# Patient Record
Sex: Female | Born: 1970 | Race: White | Hispanic: No | Marital: Married | Smoking: Never smoker
Health system: Southern US, Community
[De-identification: ages and names within clinical notes are randomized; demographics above are authoritative.]

## PROBLEM LIST (undated history)

## (undated) DIAGNOSIS — I10 Essential (primary) hypertension: Secondary | ICD-10-CM

## (undated) DIAGNOSIS — E785 Hyperlipidemia, unspecified: Secondary | ICD-10-CM

## (undated) DIAGNOSIS — E119 Type 2 diabetes mellitus without complications: Secondary | ICD-10-CM

## (undated) DIAGNOSIS — E079 Disorder of thyroid, unspecified: Secondary | ICD-10-CM

## (undated) DIAGNOSIS — F32A Depression, unspecified: Secondary | ICD-10-CM

## (undated) DIAGNOSIS — A159 Respiratory tuberculosis unspecified: Secondary | ICD-10-CM

## (undated) HISTORY — DX: Hyperlipidemia, unspecified: E78.5

## (undated) HISTORY — DX: Respiratory tuberculosis unspecified: A15.9

## (undated) HISTORY — DX: Depression, unspecified: F32.A

## (undated) HISTORY — DX: Essential (primary) hypertension: I10

## (undated) HISTORY — PX: GASTRIC FUNDOPLICATION: SHX226

## (undated) HISTORY — DX: Disorder of thyroid, unspecified: E07.9

## (undated) HISTORY — DX: Type 2 diabetes mellitus without complications: E11.9

---

## 1997-08-18 ENCOUNTER — Inpatient Hospital Stay (HOSPITAL_COMMUNITY): Admission: AD | Admit: 1997-08-18 | Discharge: 1997-08-20 | Payer: Self-pay | Admitting: *Deleted

## 1998-11-19 ENCOUNTER — Other Ambulatory Visit: Admission: RE | Admit: 1998-11-19 | Discharge: 1998-11-19 | Payer: Self-pay | Admitting: Family Medicine

## 1998-12-19 ENCOUNTER — Other Ambulatory Visit: Admission: RE | Admit: 1998-12-19 | Discharge: 1998-12-19 | Payer: Self-pay | Admitting: Obstetrics and Gynecology

## 1999-05-23 ENCOUNTER — Other Ambulatory Visit: Admission: RE | Admit: 1999-05-23 | Discharge: 1999-05-23 | Payer: Self-pay | Admitting: Obstetrics and Gynecology

## 2000-01-03 ENCOUNTER — Other Ambulatory Visit: Admission: RE | Admit: 2000-01-03 | Discharge: 2000-01-03 | Payer: Self-pay | Admitting: Family Medicine

## 2000-06-15 ENCOUNTER — Other Ambulatory Visit: Admission: RE | Admit: 2000-06-15 | Discharge: 2000-06-15 | Payer: Self-pay | Admitting: Obstetrics and Gynecology

## 2000-09-10 ENCOUNTER — Inpatient Hospital Stay (HOSPITAL_COMMUNITY): Admission: AD | Admit: 2000-09-10 | Discharge: 2000-09-10 | Payer: Self-pay | Admitting: Obstetrics and Gynecology

## 2001-01-14 ENCOUNTER — Inpatient Hospital Stay (HOSPITAL_COMMUNITY): Admission: AD | Admit: 2001-01-14 | Discharge: 2001-01-16 | Payer: Self-pay | Admitting: Obstetrics and Gynecology

## 2001-01-17 ENCOUNTER — Encounter: Admission: RE | Admit: 2001-01-17 | Discharge: 2001-02-16 | Payer: Self-pay | Admitting: Obstetrics and Gynecology

## 2001-02-26 ENCOUNTER — Other Ambulatory Visit: Admission: RE | Admit: 2001-02-26 | Discharge: 2001-02-26 | Payer: Self-pay | Admitting: Obstetrics and Gynecology

## 2002-02-14 ENCOUNTER — Other Ambulatory Visit: Admission: RE | Admit: 2002-02-14 | Discharge: 2002-02-14 | Payer: Self-pay | Admitting: Family Medicine

## 2004-05-07 ENCOUNTER — Other Ambulatory Visit: Admission: RE | Admit: 2004-05-07 | Discharge: 2004-05-07 | Payer: Self-pay | Admitting: *Deleted

## 2005-05-29 ENCOUNTER — Other Ambulatory Visit: Admission: RE | Admit: 2005-05-29 | Discharge: 2005-05-29 | Payer: Self-pay | Admitting: Family Medicine

## 2005-06-12 ENCOUNTER — Ambulatory Visit (HOSPITAL_COMMUNITY): Admission: RE | Admit: 2005-06-12 | Discharge: 2005-06-12 | Payer: Self-pay | Admitting: Family Medicine

## 2012-12-10 ENCOUNTER — Other Ambulatory Visit: Payer: Self-pay | Admitting: Family Medicine

## 2012-12-10 DIAGNOSIS — Z1231 Encounter for screening mammogram for malignant neoplasm of breast: Secondary | ICD-10-CM

## 2012-12-31 ENCOUNTER — Ambulatory Visit
Admission: RE | Admit: 2012-12-31 | Discharge: 2012-12-31 | Disposition: A | Discharge status: Home / Self Care | Payer: 59 | Source: Ambulatory Visit | Attending: Family Medicine | Admitting: Family Medicine

## 2012-12-31 ENCOUNTER — Ambulatory Visit: Payer: Self-pay

## 2012-12-31 DIAGNOSIS — Z1231 Encounter for screening mammogram for malignant neoplasm of breast: Secondary | ICD-10-CM

## 2013-12-13 ENCOUNTER — Other Ambulatory Visit: Payer: Self-pay | Admitting: Family Medicine

## 2013-12-13 DIAGNOSIS — Z1231 Encounter for screening mammogram for malignant neoplasm of breast: Secondary | ICD-10-CM

## 2014-01-02 ENCOUNTER — Ambulatory Visit: Payer: 59

## 2014-01-11 ENCOUNTER — Ambulatory Visit
Admission: RE | Admit: 2014-01-11 | Discharge: 2014-01-11 | Disposition: A | Discharge status: Home / Self Care | Payer: 59 | Source: Ambulatory Visit | Attending: Family Medicine | Admitting: Family Medicine

## 2014-01-11 DIAGNOSIS — Z1231 Encounter for screening mammogram for malignant neoplasm of breast: Secondary | ICD-10-CM

## 2014-02-27 ENCOUNTER — Telehealth: Payer: Self-pay | Admitting: *Deleted

## 2014-02-27 NOTE — Telephone Encounter (Signed)
I'm a previous patient of Dr. Charlsie Merlesegal.  I wanted to find out what to do about getting some more orthotics.  The ones I have are getting holes in them and not holding up well.  If you would give me a call back.  Thanks.

## 2014-02-28 NOTE — Telephone Encounter (Signed)
I called and left her a message that she will need to call and schedule an appointment with her doctor.  Then we could possibly get her scanned for orthotics.

## 2014-04-21 HISTORY — PX: PLANTAR FASCIA RELEASE: SHX2239

## 2014-04-27 ENCOUNTER — Ambulatory Visit (INDEPENDENT_AMBULATORY_CARE_PROVIDER_SITE_OTHER): Payer: 59

## 2014-04-27 ENCOUNTER — Encounter: Payer: Self-pay | Admitting: Podiatry

## 2014-04-27 ENCOUNTER — Ambulatory Visit (INDEPENDENT_AMBULATORY_CARE_PROVIDER_SITE_OTHER): Payer: 59 | Admitting: Podiatry

## 2014-04-27 VITALS — BP 149/83 | HR 68 | Resp 13

## 2014-04-27 DIAGNOSIS — M79671 Pain in right foot: Secondary | ICD-10-CM

## 2014-04-27 DIAGNOSIS — M779 Enthesopathy, unspecified: Secondary | ICD-10-CM

## 2014-04-27 MED ORDER — DICLOFENAC SODIUM 75 MG PO TBEC: 75.0000 mg | DELAYED_RELEASE_TABLET | Freq: Two times a day (BID) | ORAL | Status: DC

## 2014-04-27 NOTE — Progress Notes (Signed)
   Subjective:    Patient ID: Gloria Zhang, female    DOB: December 24, 1970, 44 y.o.   MRN: [id number]  HPI Comments: Pt complains of continued right plantar forefoot and plantar lateral foot pain for years.  Foot Pain      Review of Systems  All other systems reviewed and are negative.      Objective:   Physical Exam        Assessment & Plan:

## 2014-04-28 NOTE — Progress Notes (Signed)
Subjective:     Patient ID: Gloria JaegerVirginia G Zhang, female   DOB: 19-Jul-1970, 44 y.o.   MRN: [id number]  HPI patient continues to complain of moderate discomfort in the right forefoot and plantar heel that has been going on for a long time and is moderate in its intensity and is controlled well with orthotics which have begun to wear out and are longer supporting her appropriately   Review of Systems     Objective:   Physical Exam Neurovascular status intact with muscle strength adequate range of motion within normal limits and mild to moderate discomfort plantar aspect right heel and mild discomfort on the metatarsal phalangeal joint right    Assessment:     Low grade capsulitis with plantar fasciitis plantar right    Plan:     Reviewed both conditions and recommended new orthotics and scanned for customized orthotic devices. Patient will be seen back when they are ready

## 2014-05-26 ENCOUNTER — Ambulatory Visit: Payer: 59 | Admitting: *Deleted

## 2014-05-26 DIAGNOSIS — M779 Enthesopathy, unspecified: Secondary | ICD-10-CM

## 2014-05-26 NOTE — Patient Instructions (Signed)

## 2014-05-26 NOTE — Progress Notes (Signed)
PICKING UP ORTHOTICS  

## 2015-03-02 ENCOUNTER — Other Ambulatory Visit: Payer: Self-pay

## 2015-03-02 DIAGNOSIS — Z1231 Encounter for screening mammogram for malignant neoplasm of breast: Secondary | ICD-10-CM

## 2015-03-27 ENCOUNTER — Ambulatory Visit
Admission: RE | Admit: 2015-03-27 | Discharge: 2015-03-27 | Disposition: A | Discharge status: Home / Self Care | Payer: 59 | Source: Ambulatory Visit

## 2015-03-27 DIAGNOSIS — Z1231 Encounter for screening mammogram for malignant neoplasm of breast: Secondary | ICD-10-CM

## 2015-04-02 ENCOUNTER — Other Ambulatory Visit: Payer: Self-pay | Admitting: Family Medicine

## 2015-04-02 DIAGNOSIS — R928 Other abnormal and inconclusive findings on diagnostic imaging of breast: Secondary | ICD-10-CM

## 2015-04-06 ENCOUNTER — Other Ambulatory Visit: Payer: 59

## 2015-04-24 ENCOUNTER — Ambulatory Visit
Admission: RE | Admit: 2015-04-24 | Discharge: 2015-04-24 | Disposition: A | Discharge status: Home / Self Care | Payer: 59 | Source: Ambulatory Visit | Attending: Family Medicine | Admitting: Family Medicine

## 2015-04-24 ENCOUNTER — Other Ambulatory Visit: Payer: 59

## 2015-04-24 DIAGNOSIS — R928 Other abnormal and inconclusive findings on diagnostic imaging of breast: Secondary | ICD-10-CM

## 2016-04-08 ENCOUNTER — Other Ambulatory Visit: Payer: Self-pay | Admitting: Family Medicine

## 2016-04-08 DIAGNOSIS — Z1231 Encounter for screening mammogram for malignant neoplasm of breast: Secondary | ICD-10-CM

## 2016-04-24 IMAGING — MG MM DIAG BREAST TOMO UNI LEFT
4 series · 4 of 12 positions shown · non-contrast
Comparison: 03/27/2015 and earlier priors

CLINICAL DATA: Possible mass left breast identified on recent 2D
screening mammogram mammogram. A mass was questioned in the
retroareolar region on the left.

EXAM:
DIGITAL DIAGNOSTIC LEFT MAMMOGRAM WITH 3D TOMOSYNTHESIS AND CAD

[L MLO]
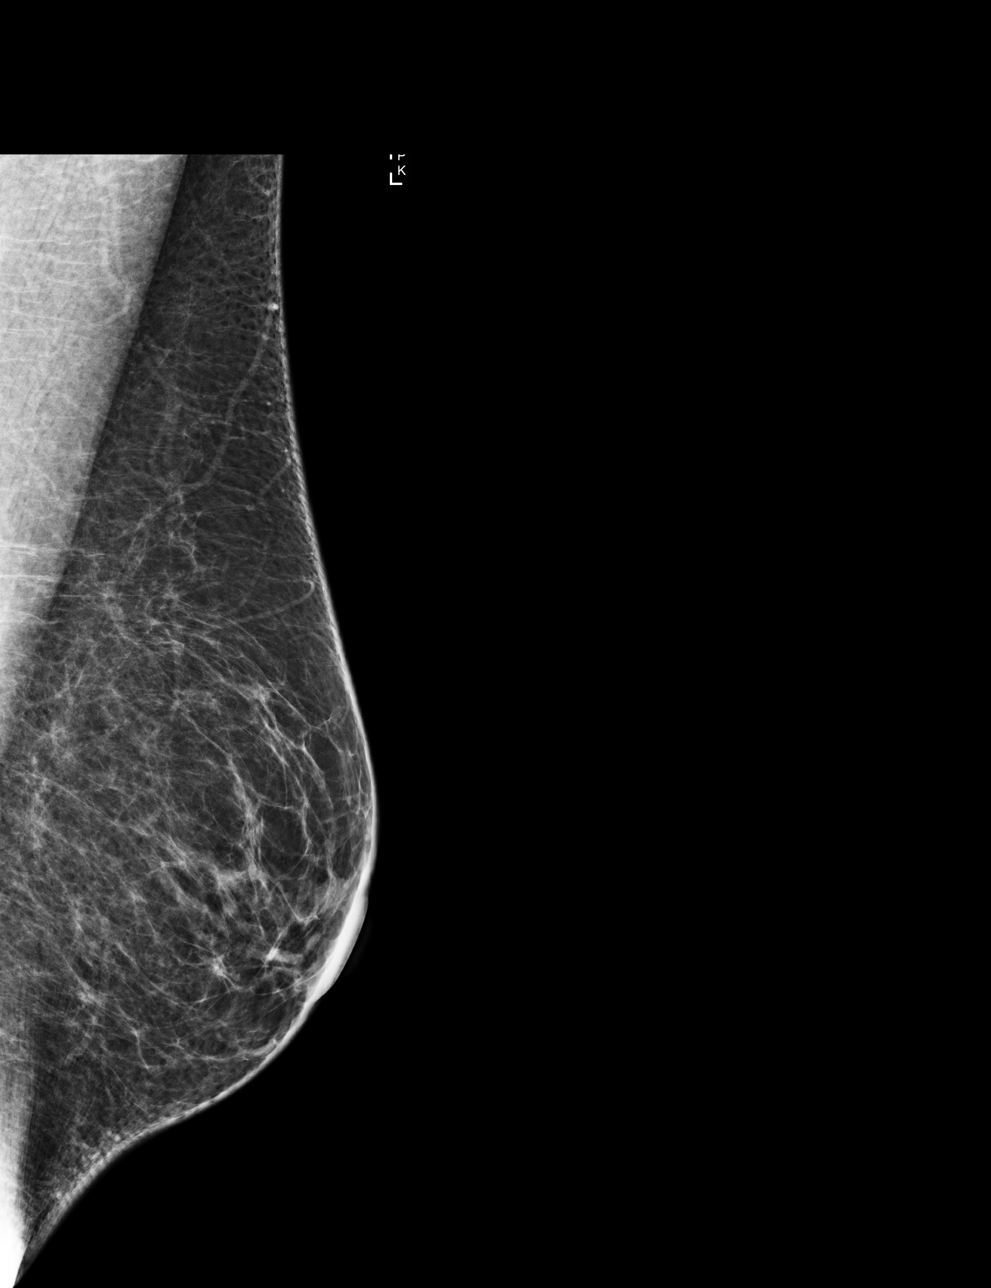

[L CC]
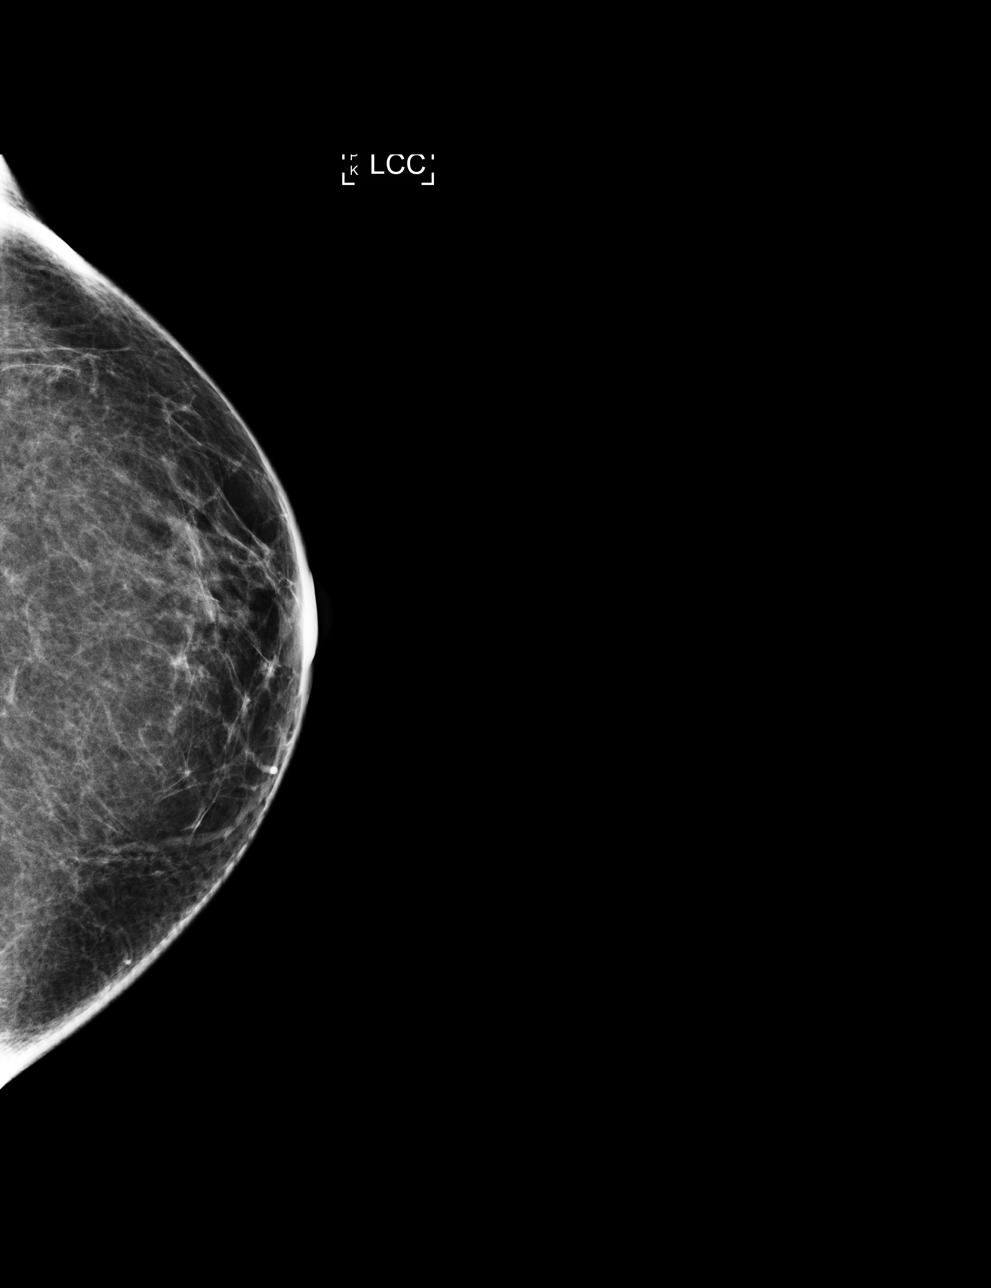

[L MLO tomo · tomo slice 39/77.0]
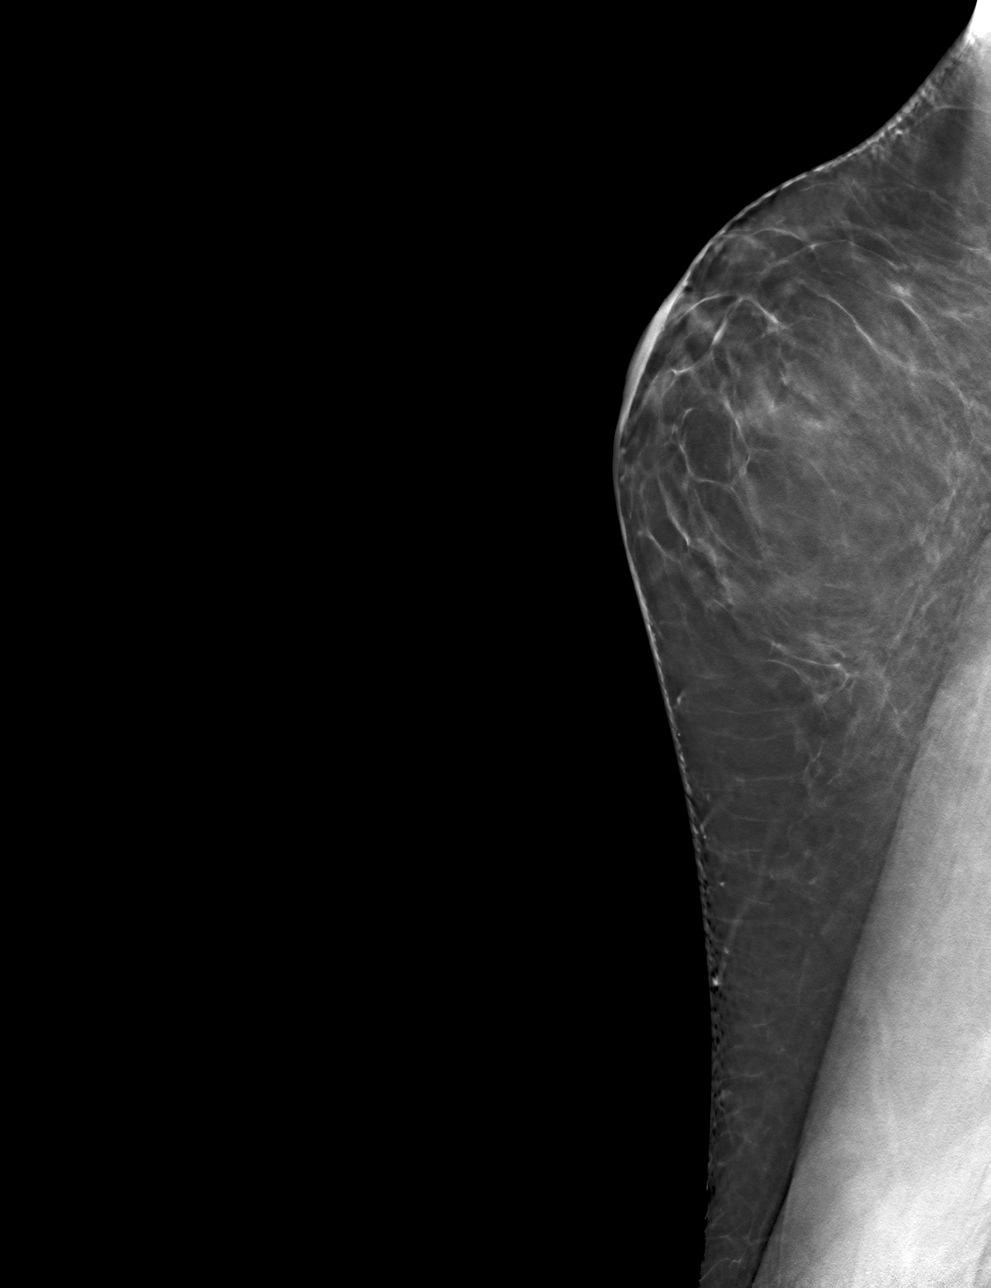

[L CC tomo · tomo slice 35/70.0]
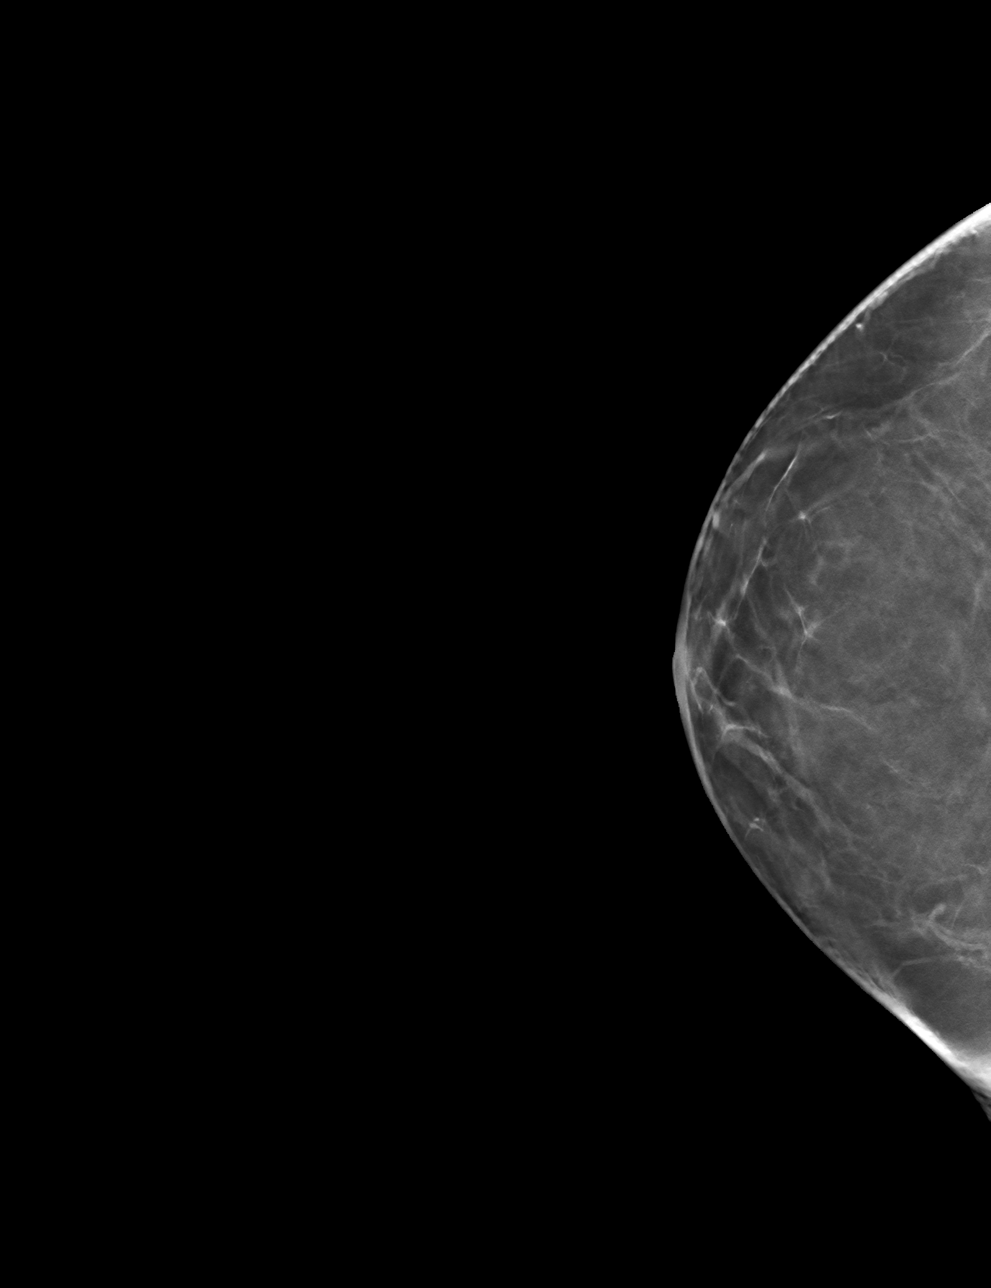

[4 of 12 positions shown; findings below may reference images not displayed]

ACR Breast Density Category b: There are scattered areas of
fibroglandular density.
FINDINGS: 3D tomographic views of the left breast in the CC and MLO projection
show normal retroareolar glandular tissue. No mass or distortion is
seen. There are no findings to suggest malignancy.

Mammographic images were processed with CAD.
IMPRESSION: No evidence of malignancy left breast.

RECOMMENDATION:
Screening mammogram in one year.(Code:CH-[id number])

I have discussed the findings and recommendations with the patient.
Results were also provided in writing at the conclusion of the
visit. If applicable, a reminder letter will be sent to the patient
regarding the next appointment.

BI-RADS CATEGORY  1: Negative.

## 2016-05-07 ENCOUNTER — Ambulatory Visit: Payer: 59

## 2016-05-08 ENCOUNTER — Ambulatory Visit: Payer: 59

## 2016-06-03 ENCOUNTER — Ambulatory Visit
Admission: RE | Admit: 2016-06-03 | Discharge: 2016-06-03 | Disposition: A | Discharge status: Home / Self Care | Payer: 59 | Source: Ambulatory Visit | Attending: Family Medicine | Admitting: Family Medicine

## 2016-06-03 DIAGNOSIS — Z1231 Encounter for screening mammogram for malignant neoplasm of breast: Secondary | ICD-10-CM

## 2017-04-28 ENCOUNTER — Other Ambulatory Visit: Payer: Self-pay | Admitting: Family Medicine

## 2017-04-28 DIAGNOSIS — Z1231 Encounter for screening mammogram for malignant neoplasm of breast: Secondary | ICD-10-CM

## 2017-06-04 ENCOUNTER — Ambulatory Visit
Admission: RE | Admit: 2017-06-04 | Discharge: 2017-06-04 | Disposition: A | Discharge status: Home / Self Care | Payer: 59 | Source: Ambulatory Visit | Attending: Family Medicine | Admitting: Family Medicine

## 2017-06-04 DIAGNOSIS — Z1231 Encounter for screening mammogram for malignant neoplasm of breast: Secondary | ICD-10-CM

## 2018-04-27 ENCOUNTER — Other Ambulatory Visit: Payer: Self-pay | Admitting: Family Medicine

## 2018-04-27 DIAGNOSIS — Z1231 Encounter for screening mammogram for malignant neoplasm of breast: Secondary | ICD-10-CM

## 2018-06-07 ENCOUNTER — Ambulatory Visit
Admission: RE | Admit: 2018-06-07 | Discharge: 2018-06-07 | Disposition: A | Discharge status: Home / Self Care | Payer: BLUE CROSS/BLUE SHIELD | Source: Ambulatory Visit | Attending: Family Medicine | Admitting: Family Medicine

## 2018-06-07 DIAGNOSIS — Z1231 Encounter for screening mammogram for malignant neoplasm of breast: Secondary | ICD-10-CM

## 2019-01-31 ENCOUNTER — Telehealth: Payer: Self-pay | Admitting: *Deleted

## 2019-01-31 NOTE — Telephone Encounter (Signed)
On 01/27/2019, faxed Rx request for Synthroid back to patient's pharmacy. The provider Windell Hummingbird, PA-C is no longer at this practice(Primary Care at [facility]). Confirmation page 2:28 pm.

## 2019-03-08 ENCOUNTER — Other Ambulatory Visit: Payer: Self-pay | Admitting: *Deleted

## 2019-03-08 DIAGNOSIS — Z20822 Contact with and (suspected) exposure to covid-19: Secondary | ICD-10-CM

## 2019-03-10 LAB — NOVEL CORONAVIRUS, NAA: SARS-CoV-2, NAA: NOT DETECTED

## 2019-05-13 ENCOUNTER — Other Ambulatory Visit: Payer: Self-pay | Admitting: Family Medicine

## 2019-05-13 DIAGNOSIS — Z1231 Encounter for screening mammogram for malignant neoplasm of breast: Secondary | ICD-10-CM

## 2019-06-20 ENCOUNTER — Other Ambulatory Visit: Payer: Self-pay

## 2019-06-20 ENCOUNTER — Ambulatory Visit
Admission: RE | Admit: 2019-06-20 | Discharge: 2019-06-20 | Disposition: A | Discharge status: Home / Self Care | Payer: BLUE CROSS/BLUE SHIELD | Source: Ambulatory Visit | Attending: Family Medicine | Admitting: Family Medicine

## 2019-06-20 DIAGNOSIS — Z1231 Encounter for screening mammogram for malignant neoplasm of breast: Secondary | ICD-10-CM

## 2020-01-12 ENCOUNTER — Ambulatory Visit: Payer: BLUE CROSS/BLUE SHIELD | Attending: Internal Medicine

## 2020-01-12 DIAGNOSIS — Z23 Encounter for immunization: Secondary | ICD-10-CM

## 2020-01-12 NOTE — Progress Notes (Signed)
   Covid-19 Vaccination Clinic  Name:  Gloria Zhang    MRN: [id number] DOB: March 31, 1971  01/12/2020  [name] was observed post Covid-19 immunization for 15 minutes without incident. She was provided with Vaccine Information Sheet and instruction to access the V-Safe system.   [name] was instructed to call 911 with any severe reactions post vaccine: Marland Kitchen Difficulty breathing  . Swelling of face and throat  . A fast heartbeat  . A bad rash all over body  . Dizziness and weakness   Immunizations Administered    Name Date Dose VIS Date Route   Pfizer COVID-19 Vaccine 01/12/2020  1:08 PM 0.3 mL 06/15/2018 Intramuscular   Manufacturer: [employer], [employer]   Lot: [id number]   NDC: [id number]

## 2020-02-02 ENCOUNTER — Ambulatory Visit: Payer: BLUE CROSS/BLUE SHIELD | Attending: Internal Medicine

## 2020-02-02 DIAGNOSIS — Z23 Encounter for immunization: Secondary | ICD-10-CM

## 2020-02-02 NOTE — Progress Notes (Signed)
   Covid-19 Vaccination Clinic  Name:  Gloria Zhang    MRN: [id number] DOB: 1970/05/04  02/02/2020  Gloria Zhang was observed post Covid-19 immunization for 15 minutes without incident. She was provided with Vaccine Information Sheet and instruction to access the V-Safe system.   Gloria Zhang was instructed to call 911 with any severe reactions post vaccine: Marland Kitchen Difficulty breathing  . Swelling of face and throat  . A fast heartbeat  . A bad rash all over body  . Dizziness and weakness   Immunizations Administered    Name Date Dose VIS Date Route   Pfizer COVID-19 Vaccine 02/02/2020  1:27 PM 0.3 mL 06/15/2018 Intramuscular   Manufacturer: [employer], [employer]   Lot: [id number]   NDC: [address]-1

## 2020-04-09 LAB — EXTERNAL GENERIC LAB PROCEDURE: COLOGUARD: NEGATIVE

## 2020-04-09 LAB — COLOGUARD: COLOGUARD: NEGATIVE

## 2020-05-08 ENCOUNTER — Other Ambulatory Visit: Payer: Self-pay | Admitting: Physician Assistant

## 2020-05-08 DIAGNOSIS — Z1231 Encounter for screening mammogram for malignant neoplasm of breast: Secondary | ICD-10-CM

## 2020-06-21 ENCOUNTER — Ambulatory Visit: Payer: BLUE CROSS/BLUE SHIELD

## 2020-08-07 ENCOUNTER — Ambulatory Visit
Admission: RE | Admit: 2020-08-07 | Discharge: 2020-08-07 | Disposition: A | Discharge status: Home / Self Care | Payer: BLUE CROSS/BLUE SHIELD | Source: Ambulatory Visit | Attending: Physician Assistant | Admitting: Physician Assistant

## 2020-08-07 ENCOUNTER — Other Ambulatory Visit: Payer: Self-pay

## 2020-08-07 DIAGNOSIS — Z1231 Encounter for screening mammogram for malignant neoplasm of breast: Secondary | ICD-10-CM

## 2021-07-26 ENCOUNTER — Other Ambulatory Visit: Payer: Self-pay | Admitting: Physician Assistant

## 2021-07-26 DIAGNOSIS — Z1231 Encounter for screening mammogram for malignant neoplasm of breast: Secondary | ICD-10-CM

## 2021-08-08 ENCOUNTER — Ambulatory Visit
Admission: RE | Admit: 2021-08-08 | Discharge: 2021-08-08 | Disposition: A | Discharge status: Home / Self Care | Payer: BC Managed Care – PPO | Source: Ambulatory Visit | Attending: Physician Assistant | Admitting: Physician Assistant

## 2021-08-08 DIAGNOSIS — Z1231 Encounter for screening mammogram for malignant neoplasm of breast: Secondary | ICD-10-CM

## 2022-07-03 ENCOUNTER — Other Ambulatory Visit: Payer: Self-pay | Admitting: Physician Assistant

## 2022-07-03 DIAGNOSIS — Z1231 Encounter for screening mammogram for malignant neoplasm of breast: Secondary | ICD-10-CM

## 2022-08-18 ENCOUNTER — Ambulatory Visit
Admission: RE | Admit: 2022-08-18 | Discharge: 2022-08-18 | Disposition: A | Discharge status: Home / Self Care | Payer: Managed Care, Other (non HMO) | Source: Ambulatory Visit | Attending: Physician Assistant | Admitting: Physician Assistant

## 2022-08-18 DIAGNOSIS — Z1231 Encounter for screening mammogram for malignant neoplasm of breast: Secondary | ICD-10-CM

## 2023-05-26 ENCOUNTER — Encounter: Payer: Self-pay | Admitting: Internal Medicine

## 2023-06-03 ENCOUNTER — Other Ambulatory Visit: Payer: Self-pay | Admitting: Medical Genetics

## 2023-06-12 ENCOUNTER — Other Ambulatory Visit (HOSPITAL_COMMUNITY): Payer: Self-pay

## 2023-06-16 ENCOUNTER — Ambulatory Visit (AMBULATORY_SURGERY_CENTER): Payer: Managed Care, Other (non HMO)

## 2023-06-16 VITALS — Ht 63.0 in | Wt 137.0 lb

## 2023-06-16 DIAGNOSIS — Z1211 Encounter for screening for malignant neoplasm of colon: Secondary | ICD-10-CM

## 2023-06-16 MED ORDER — SUFLAVE 178.7 G PO SOLR: 1.0000 | Freq: Once | ORAL | 0 refills | Status: AC

## 2023-06-16 NOTE — Progress Notes (Signed)

## 2023-06-18 ENCOUNTER — Other Ambulatory Visit (HOSPITAL_COMMUNITY)
Admission: RE | Admit: 2023-06-18 | Discharge: 2023-06-18 | Disposition: A | Discharge status: Home / Self Care | Payer: Self-pay | Source: Ambulatory Visit | Attending: Medical Genetics | Admitting: Medical Genetics

## 2023-06-25 ENCOUNTER — Encounter: Payer: Self-pay | Admitting: Internal Medicine

## 2023-06-29 ENCOUNTER — Ambulatory Visit (AMBULATORY_SURGERY_CENTER): Payer: Managed Care, Other (non HMO) | Admitting: Internal Medicine

## 2023-06-29 ENCOUNTER — Encounter: Payer: Self-pay | Admitting: Internal Medicine

## 2023-06-29 VITALS — BP 108/73 | HR 56 | Temp 98.6°F | Resp 13 | Ht 63.0 in | Wt 137.0 lb

## 2023-06-29 DIAGNOSIS — D123 Benign neoplasm of transverse colon: Secondary | ICD-10-CM

## 2023-06-29 DIAGNOSIS — K5730 Diverticulosis of large intestine without perforation or abscess without bleeding: Secondary | ICD-10-CM

## 2023-06-29 DIAGNOSIS — Z1211 Encounter for screening for malignant neoplasm of colon: Secondary | ICD-10-CM

## 2023-06-29 MED ORDER — SODIUM CHLORIDE 0.9 % IV SOLN: 500.0000 mL | INTRAVENOUS | Status: DC

## 2023-06-29 NOTE — Progress Notes (Signed)
 Pt resting comfortably. VSS. Airway intact. SBAR complete to RN. All questions answered.

## 2023-06-29 NOTE — Patient Instructions (Signed)
-  Handout on polyps and diverticulosis provided -await pathology results -repeat colonoscopy for surveillance recommended. Date to be determined when pathology result become available   -Continue present medications   YOU HAD AN ENDOSCOPIC PROCEDURE TODAY AT THE [facility]:   Refer to the procedure report that was given to you for any specific questions about what was found during the examination.  If the procedure report does not answer your questions, please call your gastroenterologist to clarify.  If you requested that your care partner not be given the details of your procedure findings, then the procedure report has been included in a sealed envelope for you to review at your convenience later.  YOU SHOULD EXPECT: Some feelings of bloating in the abdomen. Passage of more gas than usual.  Walking can help get rid of the air that was put into your GI tract during the procedure and reduce the bloating. If you had a lower endoscopy (such as a colonoscopy or flexible sigmoidoscopy) you may notice spotting of blood in your stool or on the toilet paper. If you underwent a bowel prep for your procedure, you may not have a normal bowel movement for a few days.  Please Note:  You might notice some irritation and congestion in your nose or some drainage.  This is from the oxygen used during your procedure.  There is no need for concern and it should clear up in a day or so.  SYMPTOMS TO REPORT IMMEDIATELY:  Following lower endoscopy (colonoscopy or flexible sigmoidoscopy):  Excessive amounts of blood in the stool  Significant tenderness or worsening of abdominal pains  Swelling of the abdomen that is new, acute  Fever of 100F or higher   For urgent or emergent issues, a gastroenterologist can be reached at any hour by calling [phone]. Do not use MyChart messaging for urgent concerns.    DIET:  We do recommend a small meal at first, but then you may proceed to your regular  diet.  Drink plenty of fluids but you should avoid alcoholic beverages for 24 hours.  ACTIVITY:  You should plan to take it easy for the rest of today and you should NOT DRIVE or use heavy machinery until tomorrow (because of the sedation medicines used during the test).    FOLLOW UP: Our staff will call the number listed on your records the next business day following your procedure.  We will call around 7:15- 8:00 am to check on you and address any questions or concerns that you may have regarding the information given to you following your procedure. If we do not reach you, we will leave a message.     If any biopsies were taken you will be contacted by phone or by letter within the next 1-3 weeks.  Please call us at [phone] if you have not heard about the biopsies in 3 weeks.    SIGNATURES/CONFIDENTIALITY: You and/or your care partner have signed paperwork which will be entered into your electronic medical record.  These signatures attest to the fact that that the information above on your After Visit Summary has been reviewed and is understood.  Full responsibility of the confidentiality of this discharge information lies with you and/or your care-partner.   

## 2023-06-29 NOTE — Progress Notes (Signed)
 Called to room to assist during endoscopic procedure.  Patient ID and intended procedure confirmed with present staff. Received instructions for my participation in the procedure from the performing physician.

## 2023-06-29 NOTE — Progress Notes (Signed)
 HISTORY OF PRESENT ILLNESS:  Gloria Zhang is a 53 y.o. female sent for colon cancer screening.  No complaints  REVIEW OF SYSTEMS:  All non-GI ROS negative except for  Past Medical History:  Diagnosis Date   Depression    Diabetes mellitus without complication (HCC)    Hyperlipidemia    Hypertension    Thyroid disease    Tuberculosis     Past Surgical History:  Procedure Laterality Date   GASTRIC FUNDOPLICATION     PLANTAR FASCIA RELEASE Right 2016    Social History Gloria Zhang  reports that she has never smoked. She has never used smokeless tobacco. She reports that she does not drink alcohol and does not use drugs.  family history includes Breast cancer in her mother.  No Known Allergies     PHYSICAL EXAMINATION: Vital signs: BP 132/79   Pulse 60   Temp 98.6 F (37 C) (Skin)   Ht 5\' 3"  (1.6 m)   Wt 137 lb (62.1 kg)   SpO2 99%   BMI 24.27 kg/m  General: Well-developed, well-nourished, no acute distress HEENT: Sclerae are anicteric, conjunctiva pink. Oral mucosa intact Lungs: Clear Heart: Regular Abdomen: soft, nontender, nondistended, no obvious ascites, no peritoneal signs, normal bowel sounds. No organomegaly. Extremities: No edema Psychiatric: alert and oriented x3. Cooperative     ASSESSMENT:  Colon cancer screening   PLAN:   Screening colonoscopy

## 2023-06-29 NOTE — Op Note (Signed)
 [facility] Patient Name: Gloria Zhang Procedure Date: 06/29/2023 9:09 AM MRN: [id number] Endoscopist: Wilhemina Bonito. Marina Goodell , MD, [id number] Age: 53 Referring MD:  Date of Birth: March 12, 1971 Gender: Female Account #: [id number] Procedure:                Colonoscopy with cold snare polypectomy x 1 Indications:              Screening for colorectal malignant neoplasm Medicines:                Monitored Anesthesia Care Procedure:                Pre-Anesthesia Assessment:                           - Prior to the procedure, a History and Physical                            was performed, and patient medications and                            allergies were reviewed. The patient's tolerance of                            previous anesthesia was also reviewed. The risks                            and benefits of the procedure and the sedation                            options and risks were discussed with the patient.                            All questions were answered, and informed consent                            was obtained. Prior Anticoagulants: The patient has                            taken no anticoagulant or antiplatelet agents. ASA                            Grade Assessment: II - A patient with mild systemic                            disease. After reviewing the risks and benefits,                            the patient was deemed in satisfactory condition to                            undergo the procedure.                           After obtaining informed consent, the colonoscope  was passed under direct vision. Throughout the                            procedure, the patient's blood pressure, pulse, and                            oxygen saturations were monitored continuously. The                            CF [id number] #[id number] was introduced through the anus                            and advanced to the the cecum, identified by                             appendiceal orifice and ileocecal valve. The                            ileocecal valve, appendiceal orifice, and rectum                            were photographed. The quality of the bowel                            preparation was excellent. The colonoscopy was                            performed without difficulty. The patient tolerated                            the procedure well. The bowel preparation used was                            SUPREP via split dose instruction. Scope In: 9:33:15 AM Scope Out: 9:51:28 AM Scope Withdrawal Time: 0 hours 12 minutes 52 seconds  Total Procedure Duration: 0 hours 18 minutes 13 seconds  Findings:                 A 2 mm polyp was found in the transverse colon. The                            polyp was removed with a cold snare. Resection and                            retrieval were complete.                           Multiple diverticula were found in the entire colon.                           The exam was otherwise without abnormality on                            direct and retroflexion views. Complications:  No immediate complications. Estimated blood loss:                            None. Estimated Blood Loss:     Estimated blood loss: none. Impression:               - One 2 mm polyp in the transverse colon, removed                            with a cold snare. Resected and retrieved.                           - Diverticulosis in the entire examined colon.                           - The examination was otherwise normal on direct                            and retroflexion views. Recommendation:           - Repeat colonoscopy in 7-10 years for surveillance.                           - Patient has a contact number available for                            emergencies. The signs and symptoms of potential                            delayed complications were discussed with the                            patient. Return  to normal activities tomorrow.                            Written discharge instructions were provided to the                            patient.                           - Resume previous diet.                           - Continue present medications.                           - Await pathology results. Wilhemina Bonito. Marina Goodell, MD 06/29/2023 9:55:26 AM This report has been signed electronically.

## 2023-06-29 NOTE — Progress Notes (Signed)
 Patient states there have been no changes to medical or surgical history since time of pre-visit.

## 2023-06-30 ENCOUNTER — Telehealth: Payer: Self-pay

## 2023-06-30 LAB — GENECONNECT MOLECULAR SCREEN: Genetic Analysis Overall Interpretation: NEGATIVE

## 2023-06-30 NOTE — Telephone Encounter (Signed)
  Follow up Call-     06/29/2023    7:45 AM  Call back number  Post procedure Call Back phone  # [phone]  Permission to leave phone message Yes     Patient questions:  Do you have a fever, pain , or abdominal swelling? No. Pain Score  0 *  Have you tolerated food without any problems? Yes.    Have you been able to return to your normal activities? Yes.    Do you have any questions about your discharge instructions: Diet   No. Medications  No. Follow up visit  No.  Do you have questions or concerns about your Care? No.  Actions: * If pain score is 4 or above: No action needed, pain <4.

## 2023-07-01 LAB — SURGICAL PATHOLOGY

## 2023-07-02 ENCOUNTER — Encounter: Payer: Self-pay | Admitting: Internal Medicine

## 2023-10-22 ENCOUNTER — Other Ambulatory Visit: Payer: Self-pay | Admitting: Physician Assistant

## 2023-10-22 DIAGNOSIS — Z1231 Encounter for screening mammogram for malignant neoplasm of breast: Secondary | ICD-10-CM

## 2023-11-11 ENCOUNTER — Ambulatory Visit
Admission: RE | Admit: 2023-11-11 | Discharge: 2023-11-11 | Disposition: A | Discharge status: Home / Self Care | Source: Ambulatory Visit | Attending: Physician Assistant | Admitting: Physician Assistant

## 2023-11-11 DIAGNOSIS — Z1231 Encounter for screening mammogram for malignant neoplasm of breast: Secondary | ICD-10-CM
